# Patient Record
Sex: Female | Born: 1960 | Race: White | Hispanic: No | Smoking: Current every day smoker
Health system: Southern US, Community
[De-identification: ages and names within clinical notes are randomized; demographics above are authoritative.]

## PROBLEM LIST (undated history)

## (undated) DIAGNOSIS — J439 Emphysema, unspecified: Secondary | ICD-10-CM

## (undated) DIAGNOSIS — R51 Headache: Secondary | ICD-10-CM

## (undated) DIAGNOSIS — G8929 Other chronic pain: Secondary | ICD-10-CM

## (undated) HISTORY — DX: Headache: R51

## (undated) HISTORY — DX: Emphysema, unspecified: J43.9

## (undated) HISTORY — DX: Other chronic pain: G89.29

---

## 1982-10-12 HISTORY — PX: APPENDECTOMY: SHX54

## 1986-10-12 HISTORY — PX: GALLBLADDER SURGERY: SHX652

## 1986-10-12 HISTORY — PX: TUBAL LIGATION: SHX77

## 1990-10-12 HISTORY — PX: VAGINAL HYSTERECTOMY: SUR661

## 2014-02-27 ENCOUNTER — Ambulatory Visit (INDEPENDENT_AMBULATORY_CARE_PROVIDER_SITE_OTHER): Payer: Self-pay | Admitting: Critical Care Medicine

## 2014-02-27 ENCOUNTER — Encounter: Payer: Self-pay | Admitting: Critical Care Medicine

## 2014-02-27 VITALS — BP 136/84 | HR 76 | Ht 64.0 in | Wt 184.0 lb

## 2014-02-27 DIAGNOSIS — J441 Chronic obstructive pulmonary disease with (acute) exacerbation: Secondary | ICD-10-CM

## 2014-02-27 DIAGNOSIS — F17200 Nicotine dependence, unspecified, uncomplicated: Secondary | ICD-10-CM | POA: Insufficient documentation

## 2014-02-27 DIAGNOSIS — G8929 Other chronic pain: Secondary | ICD-10-CM

## 2014-02-27 DIAGNOSIS — J439 Emphysema, unspecified: Secondary | ICD-10-CM

## 2014-02-27 DIAGNOSIS — R51 Headache: Secondary | ICD-10-CM

## 2014-02-27 MED ORDER — PREDNISONE 10 MG PO TABS: ORAL_TABLET | ORAL | Status: DC

## 2014-02-27 MED ORDER — OMEPRAZOLE 20 MG PO CPDR: 20.0000 mg | DELAYED_RELEASE_CAPSULE | Freq: Two times a day (BID) | ORAL | Status: AC

## 2014-02-27 MED ORDER — AZITHROMYCIN 250 MG PO TABS: 250.0000 mg | ORAL_TABLET | Freq: Every day | ORAL | Status: DC

## 2014-02-27 NOTE — Assessment & Plan Note (Signed)
Asthmatic bronchitis with flare and ongoing tobacco abuse  plan Prednisone 10mg  Take 4 tablets daily for 5 days then stop Azithromycin 250mg  Take two once then one daily until gone Stay on symbicort two puff twice daily Increase omeprazole to 20mg  twice daily before meals Stop smoking, use nicorette minis Pulmonary functions will be obtained at [facility] Return 6 weeks

## 2014-02-27 NOTE — Progress Notes (Signed)
Subjective:    Patient ID: Debra Peterson, female    DOB: 06/19/1961, 53 y.o.   MRN: [id number]  HPI Comments: Pt just got medicaid. Dx Copd 7024yrs ago.  Pt smokes 5cig /day.  Was 1.5 ppd.  No oxygen.  No cpap at night  Shortness of Breath This is a chronic problem. The current episode started more than 1 year ago. Episode frequency: notes DOE, walking bedroom to kitchen, is out of breath. heat and humidity a problem. The problem has been gradually worsening. Associated symptoms include chest pain, headaches, orthopnea, PND, a sore throat, sputum production and wheezing. Pertinent negatives include no abdominal pain, claudication, coryza, ear pain, fever, hemoptysis, leg pain, leg swelling, neck pain, rash, rhinorrhea or vomiting. The symptoms are aggravated by weather changes, URIs, fumes, odors, lying flat, eating, any activity and exercise. Associated symptoms comments: Pt has daily GERD Chronic cough, min productive, mucus will look clear. Risk factors include smoking.   Never had any PFTs.    Past Medical History  Diagnosis Date  . Emphysema lung   . Chronic headaches      Family History  Problem Relation Age of Onset  . Emphysema Paternal Grandfather   . Emphysema Mother   . Asthma Brother     2 brothers  . Cancer Mother     lung  . Cancer Father     lung  . Cancer Paternal Grandmother     breast  . Cancer Paternal Aunt     breast  . Cancer Cousin     breast  . Heart disease Sister      History   Social History  . Marital Status: N/A    Spouse Name: N/A    Number of Children: N/A  . Years of Education: N/A   Occupational History  . Not on file.   Social History Main Topics  . Smoking status: Current Every Day Smoker -- 1.50 packs/day for 38 years    Types: Cigarettes  . Smokeless tobacco: Never Used     Comment: down to .25 pack/day X2 weeks.   . Alcohol Use: No  . Drug Use: No  . Sexual Activity: Not on file   Other Topics Concern  . Not on file    Social History Narrative  . No narrative on file     Allergies  Allergen Reactions  . Ivp Dye [Iodinated Diagnostic Agents]     hives     No outpatient prescriptions prior to visit.   No facility-administered medications prior to visit.      Review of Systems  Constitutional: Positive for appetite change. Negative for fever and unexpected weight change.  HENT: Positive for congestion and sore throat. Negative for dental problem, ear pain, nosebleeds, postnasal drip, rhinorrhea, sinus pressure, sneezing and trouble swallowing.   Eyes: Negative for redness and itching.  Respiratory: Positive for cough, sputum production, shortness of breath and wheezing. Negative for hemoptysis and chest tightness.   Cardiovascular: Positive for chest pain, orthopnea and PND. Negative for palpitations, claudication and leg swelling.  Gastrointestinal: Negative for nausea, vomiting and abdominal pain.  Genitourinary: Negative for dysuria.  Musculoskeletal: Negative for joint swelling and neck pain.  Skin: Negative for rash.  Neurological: Positive for headaches.  Hematological: Does not bruise/bleed easily.  Psychiatric/Behavioral: Positive for dysphoric mood. The patient is nervous/anxious.        Objective:   Physical Exam Filed Vitals:   02/27/14 1549  BP: 136/84  Pulse: 76  Height: 5'  4" (1.626 m)  Weight: 184 lb (83.462 kg)  SpO2: 95%    Gen: Pleasant, well-nourished, in no distress,  normal affect  ENT: No lesions,  mouth clear,  oropharynx clear, no postnasal drip  Neck: No JVD, no TMG, no carotid bruits  Lungs: No use of accessory muscles, no dullness to percussion, exp wheezes  Cardiovascular: RRR, heart sounds normal, no murmur or gallops, no peripheral edema  Abdomen: soft and NT, no HSM,  BS normal  Musculoskeletal: No deformities, no cyanosis or clubbing  Neuro: alert, non focal  Skin: Warm, no lesions or rashes  No results found.  CXR chronic  changes       Assessment & Plan:   Obstructive chronic bronchitis with exacerbation Asthmatic bronchitis with flare and ongoing tobacco abuse  plan Prednisone 10mg  Take 4 tablets daily for 5 days then stop Azithromycin 250mg  Take two once then one daily until gone Stay on symbicort two puff twice daily Increase omeprazole to 20mg  twice daily before meals Stop smoking, use nicorette minis Pulmonary functions will be obtained at [facility] Return 6 weeks    Updated Medication List Outpatient Encounter Prescriptions as of 02/27/2014  Medication Sig  . albuterol (PROVENTIL HFA;VENTOLIN HFA) 108 (90 BASE) MCG/ACT inhaler Inhale 2 puffs into the lungs every 6 (six) hours as needed for wheezing or shortness of breath.  Marland Kitchen. albuterol (PROVENTIL) (2.5 MG/3ML) 0.083% nebulizer solution Take 2.5 mg by nebulization every 6 (six) hours as needed for wheezing or shortness of breath.  . budesonide-formoterol (SYMBICORT) 160-4.5 MCG/ACT inhaler Inhale 2 puffs into the lungs 2 (two) times daily.  Marland Kitchen. buPROPion (WELLBUTRIN SR) 150 MG 12 hr tablet Take 150 mg by mouth daily.  Marland Kitchen. dicyclomine (BENTYL) 10 MG capsule Take 10 mg by mouth 2 (two) times daily.  Marland Kitchen. omeprazole (PRILOSEC) 20 MG capsule Take 1 capsule (20 mg total) by mouth 2 (two) times daily before a meal.  . sertraline (ZOLOFT) 100 MG tablet Take 100 mg by mouth daily.  . [DISCONTINUED] omeprazole (PRILOSEC) 20 MG capsule Take 20 mg by mouth daily.  Marland Kitchen. azithromycin (ZITHROMAX) 250 MG tablet Take 1 tablet (250 mg total) by mouth daily. Take two once then one daily until gone  . predniSONE (DELTASONE) 10 MG tablet Take 4 tablets daily for 5 days then stop

## 2014-02-27 NOTE — Patient Instructions (Signed)
Prednisone 10mg  Take 4 tablets daily for 5 days then stop Azithromycin 250mg  Take two once then one daily until gone Stay on symbicort two puff twice daily Increase omeprazole to 20mg  twice daily before meals Stop smoking, use nicorette minis Pul[facility] functions will be obtained at [facility] Return 6 weeks

## 2014-02-28 DIAGNOSIS — M5412 Radiculopathy, cervical region: Secondary | ICD-10-CM | POA: Insufficient documentation

## 2014-02-28 DIAGNOSIS — G43909 Migraine, unspecified, not intractable, without status migrainosus: Secondary | ICD-10-CM | POA: Insufficient documentation

## 2014-03-16 ENCOUNTER — Telehealth: Payer: Self-pay | Admitting: Critical Care Medicine

## 2014-03-16 DIAGNOSIS — J441 Chronic obstructive pulmonary disease with (acute) exacerbation: Secondary | ICD-10-CM

## 2014-03-16 NOTE — Telephone Encounter (Signed)
Call pt and tell her pfts show mild obstruction and reversable Stay on symbicort for now.

## 2014-03-19 NOTE — Telephone Encounter (Signed)
Called, spoke with pt.  Informed her of PFT results and recs per PW.  She verbalized understanding and voiced no further questions or concerns at this time.

## 2014-04-10 ENCOUNTER — Encounter: Payer: Self-pay | Admitting: Critical Care Medicine

## 2014-04-10 ENCOUNTER — Telehealth: Payer: Self-pay | Admitting: Critical Care Medicine

## 2014-04-10 ENCOUNTER — Ambulatory Visit (INDEPENDENT_AMBULATORY_CARE_PROVIDER_SITE_OTHER): Payer: Self-pay | Admitting: Critical Care Medicine

## 2014-04-10 VITALS — BP 118/82 | HR 83 | Temp 98.2°F | Ht 64.0 in | Wt 183.8 lb

## 2014-04-10 DIAGNOSIS — J441 Chronic obstructive pulmonary disease with (acute) exacerbation: Secondary | ICD-10-CM

## 2014-04-10 DIAGNOSIS — Z23 Encounter for immunization: Secondary | ICD-10-CM

## 2014-04-10 MED ORDER — METHYLPREDNISOLONE ACETATE 80 MG/ML IJ SUSP
120.0000 mg | Freq: Once | INTRAMUSCULAR | Status: AC
  Administered 2014-04-10: 120 mg via INTRAMUSCULAR

## 2014-04-10 NOTE — Assessment & Plan Note (Signed)
A depo medrol 120mg injection was given Focus on smoking cessation Stay on symbicort and use nebulizer as needed Pneumovax was given Return 2 months 

## 2014-04-10 NOTE — Progress Notes (Signed)
Subjective:    Patient ID: Debra Peterson, female    DOB: 06-Dec-1960, 53 y.o.   MRN: [id number]  HPI Comments: Pt just got medicaid. Dx Copd 1935yrs ago.  Pt smokes 5cig /day.  Was 1.5 ppd.  No oxygen.  No cpap at night  Never had any PFTs.    04/10/2014 Chief Complaint  Patient presents with  . Follow-up    cough same,sob same, humidity makes worse, staying inside,left side back soreness,wheezing, no fcs, midchest tightness, needs new neb. machine uses with oxygen mask  Pt still smoking. Difficult time with the heat. Pt notespred helped .  The pt is still on symbicort daily.  Pt notes some cough and wheezing.   Pt denies any significant sore throat, nasal congestion or excess secretions, fever, chills, sweats, unintended weight loss, pleurtic or exertional chest pain, orthopnea PND, or leg swelling Pt denies any increase in rescue therapy over baseline, denies waking up needing it or having any early am or nocturnal exacerbations of coughing/wheezing/or dyspnea.  Heat is an issue     Review of Systems  Constitutional: Positive for appetite change. Negative for unexpected weight change.  HENT: Positive for congestion. Negative for dental problem, nosebleeds, postnasal drip, sinus pressure, sneezing and trouble swallowing.   Eyes: Negative for redness and itching.  Respiratory: Positive for cough. Negative for chest tightness.   Cardiovascular: Negative for palpitations.  Gastrointestinal: Negative for nausea.  Genitourinary: Negative for dysuria.  Musculoskeletal: Negative for joint swelling.  Hematological: Does not bruise/bleed easily.  Psychiatric/Behavioral: Positive for dysphoric mood. The patient is nervous/anxious.        Objective:   Physical Exam  Filed Vitals:   04/10/14 1051  BP: 118/82  Pulse: 83  Temp: 98.2 F (36.8 C)  TempSrc: Oral  Height: 5\' 4"  (1.626 m)  Weight: 183 lb 12.8 oz (83.371 kg)  SpO2: 99%    Gen: Pleasant, well-nourished, in no distress,   normal affect  ENT: No lesions,  mouth clear,  oropharynx clear, no postnasal drip  Neck: No JVD, no TMG, no carotid bruits  Lungs: No use of accessory muscles, no dullness to percussion, exp wheezes  Cardiovascular: RRR, heart sounds normal, no murmur or gallops, no peripheral edema  Abdomen: soft and NT, no HSM,  BS normal  Musculoskeletal: No deformities, no cyanosis or clubbing  Neuro: alert, non focal  Skin: Warm, no lesions or rashes  No results found.  Cleda DaubSpiro: mild obstruction      Assessment & Plan:   Obstructive chronic bronchitis with exacerbation A depo medrol 120mg  injection was given Focus on smoking cessation Stay on symbicort and use nebulizer as needed Pneumovax was given Return 2 months     Updated Medication List Outpatient Encounter Prescriptions as of 04/10/2014  Medication Sig  . albuterol (PROVENTIL HFA;VENTOLIN HFA) 108 (90 BASE) MCG/ACT inhaler Inhale 2 puffs into the lungs every 6 (six) hours as needed for wheezing or shortness of breath.  Marland Kitchen. albuterol (PROVENTIL) (2.5 MG/3ML) 0.083% nebulizer solution Take 2.5 mg by nebulization every 6 (six) hours as needed for wheezing or shortness of breath.  . budesonide-formoterol (SYMBICORT) 160-4.5 MCG/ACT inhaler Inhale 2 puffs into the lungs 2 (two) times daily.  Marland Kitchen. buPROPion (WELLBUTRIN SR) 150 MG 12 hr tablet Take 150 mg by mouth daily.  Marland Kitchen. dicyclomine (BENTYL) 10 MG capsule Take 10 mg by mouth 2 (two) times daily.  Marland Kitchen. omeprazole (PRILOSEC) 20 MG capsule Take 1 capsule (20 mg total) by mouth 2 (two) times daily  before a meal.  . sertraline (ZOLOFT) 100 MG tablet Take 100 mg by mouth daily.  . [DISCONTINUED] azithromycin (ZITHROMAX) 250 MG tablet Take 1 tablet (250 mg total) by mouth daily. Take two once then one daily until gone  . [DISCONTINUED] predniSONE (DELTASONE) 10 MG tablet Take 4 tablets daily for 5 days then stop

## 2014-04-10 NOTE — Telephone Encounter (Signed)
Called home # provided. Was advised pt will not be back x 5 days as she went camping Called alternate # and went straight to VM-but this has not been set up yet wcb

## 2014-04-10 NOTE — Patient Instructions (Addendum)
A depo medrol 120mg  injection was given Focus on smoking cessation Stay on symbicort and use nebulizer as needed Pneumovax was given Return 2 months

## 2014-04-11 NOTE — Telephone Encounter (Signed)
Called spoke with patient @ [phone] Pt stated that [employer] has sent our office a records release form and has yet to hear anything.  Pt sees PW in the [place] office but provided the [place] office address.  Advised pt will try medical records to see if they received the request, and if not [facility] @ [phone].  Called [facility] and spoke with Marcelino DusterMichelle who did receive the request on 6.16.15, turn around time is 10-14 business days.  ATC pt to inform her of the above - line went straight to VM but unable to LM.  WCB.

## 2014-04-12 NOTE — Telephone Encounter (Signed)
ATC pt - line rang several times with NA and no option to leave msg.  WCB 

## 2014-04-16 NOTE — Telephone Encounter (Signed)
Called, spoke with pt.  Informed her of below - [employer] did receive the request on June 16 and turn around time is 10-14 business days.  She verbalized understanding and voiced no further questions or concerns at this time.

## 2014-05-15 ENCOUNTER — Ambulatory Visit (INDEPENDENT_AMBULATORY_CARE_PROVIDER_SITE_OTHER): Payer: Self-pay | Admitting: Critical Care Medicine

## 2014-05-15 ENCOUNTER — Encounter: Payer: Self-pay | Admitting: Critical Care Medicine

## 2014-05-15 VITALS — BP 122/82 | HR 79 | Temp 97.5°F | Ht 64.0 in | Wt 181.6 lb

## 2014-05-15 DIAGNOSIS — F17200 Nicotine dependence, unspecified, uncomplicated: Secondary | ICD-10-CM

## 2014-05-15 DIAGNOSIS — J441 Chronic obstructive pulmonary disease with (acute) exacerbation: Secondary | ICD-10-CM

## 2014-05-15 MED ORDER — PREDNISONE 10 MG PO TABS: ORAL_TABLET | ORAL | Status: DC

## 2014-05-15 MED ORDER — ALBUTEROL SULFATE (2.5 MG/3ML) 0.083% IN NEBU: 2.5000 mg | INHALATION_SOLUTION | Freq: Four times a day (QID) | RESPIRATORY_TRACT | Status: AC | PRN

## 2014-05-15 NOTE — Patient Instructions (Signed)
Prednisone 10mg  Take 4 for two days three for two days two for two days one for two days Albuterol refill sent to pharmacy An overnight sleep oximetry study was ordered per [facility] on room air Stay on symbicort Continue to focus on quitting smoking Staff will guide you on disability paperwork issue Return 2 months

## 2014-05-15 NOTE — Assessment & Plan Note (Signed)
Chronic obstructive airways disease with mild reversible airway obstruction and ongoing use Need to evaluate for nocturnal oxygen desaturation Plan Prednisone 10mg  Take 4 for two days three for two days two for two days one for two days Albuterol refill sent to pharmacy An overnight sleep oximetry study was ordered per [facility] on room air Stay on symbicort Continue to focus on quitting smoking Return 2 months

## 2014-05-15 NOTE — Progress Notes (Signed)
Subjective:    Patient ID: Debra Peterson, female    DOB: Oct 20, 1960, 53 y.o.   MRN: [id number]  HPI 05/15/2014 Chief Complaint  Patient presents with  . Follow-up    has [place] trip planned for next wk.,staying inside,sob-same,cough-dry, wheezing,midchest tightness, feels tight in left side of back,no fcs,sleeps a lot, no energy,out of albuterol neb. med,?'s about disablity papers   Pt with cough and dyspnea.  Pt remains fatigued  Pt does snore.  Never had sleep study Not much wheeze.  Left side hurts. Still smokes Pt denies any significant sore throat, nasal congestion or excess secretions, fever, chills, sweats, unintended weight loss, pleurtic or exertional chest pain, orthopnea PND, or leg swelling Pt denies any increase in rescue therapy over baseline, denies waking up needing it or having any early am or nocturnal exacerbations of coughing/wheezing/or dyspnea. Pt also denies any obvious fluctuation in symptoms with  weather or environmental change or other alleviating or aggravating factors     Review of Systems Constitutional:   No  weight loss, night sweats,  Fevers, chills, fatigue, lassitude. HEENT:   No headaches,  Difficulty swallowing,  Tooth/dental problems,  Sore throat,                No sneezing, itching, ear ache, nasal congestion, post nasal drip,   CV:  No chest pain,  Orthopnea, PND, swelling in lower extremities, anasarca, dizziness, palpitations  GI  No heartburn, indigestion, abdominal pain, nausea, vomiting, diarrhea, change in bowel habits, loss of appetite  Resp: Notes  shortness of breath with exertion not  at rest.  No excess mucus, no productive cough,  Notes  non-productive cough,  No coughing up of blood.  No change in color of mucus.  No wheezing.  No chest wall deformity  Skin: no rash or lesions.  GU: no dysuria, change in color of urine, no urgency or frequency.  No flank pain.  MS:  No joint pain or swelling.  No decreased range of motion.  No  back pain.  Psych:  No change in mood or affect. No depression or anxiety.  No memory loss.     Objective:   Physical Exam Filed Vitals:   05/15/14 1124  BP: 122/82  Pulse: 79  Temp: 97.5 F (36.4 C)  TempSrc: Oral  Height: 5\' 4"  (1.626 m)  Weight: 181 lb 9.6 oz (82.373 kg)  SpO2: 98%    Gen: Pleasant, well-nourished, in no distress,  normal affect  ENT: No lesions,  mouth clear,  oropharynx clear, no postnasal drip  Neck: No JVD, no TMG, no carotid bruits  Lungs: No use of accessory muscles, no dullness to percussion, distant breath sounds  Cardiovascular: RRR, heart sounds normal, no murmur or gallops, no peripheral edema  Abdomen: soft and NT, no HSM,  BS normal  Musculoskeletal: No deformities, no cyanosis or clubbing  Neuro: alert, non focal  Skin: Warm, no lesions or rashes  No results found.        Assessment & Plan:   Obstructive chronic bronchitis with exacerbation Chronic obstructive airways disease with mild reversible airway obstruction and ongoing use Need to evaluate for nocturnal oxygen desaturation Plan Prednisone 10mg  Take 4 for two days three for two days two for two days one for two days Albuterol refill sent to pharmacy An overnight sleep oximetry study was ordered per [facility] on room air Stay on symbicort Continue to focus on quitting smoking Return 2 months    Updated Medication  List Outpatient Encounter Prescriptions as of 05/15/2014  Medication Sig  . albuterol (PROVENTIL HFA;VENTOLIN HFA) 108 (90 BASE) MCG/ACT inhaler Inhale 2 puffs into the lungs every 6 (six) hours as needed for wheezing or shortness of breath.  Marland Kitchen albuterol (PROVENTIL) (2.5 MG/3ML) 0.083% nebulizer solution Take 3 mLs (2.5 mg total) by nebulization every 6 (six) hours as needed for wheezing or shortness of breath.  . budesonide-formoterol (SYMBICORT) 160-4.5 MCG/ACT inhaler Inhale 2 puffs into the lungs 2 (two) times daily.  Marland Kitchen buPROPion (WELLBUTRIN  SR) 150 MG 12 hr tablet Take 150 mg by mouth daily.  . diazepam (VALIUM) 5 MG tablet Take 5 mg by mouth.  . dicyclomine (BENTYL) 10 MG capsule Take 10 mg by mouth 2 (two) times daily.  . nabumetone (RELAFEN) 750 MG tablet Take 750 mg by mouth.  Marland Kitchen omeprazole (PRILOSEC) 20 MG capsule Take 1 capsule (20 mg total) by mouth 2 (two) times daily before a meal.  . ranitidine (ZANTAC) 150 MG tablet Once a day  . sertraline (ZOLOFT) 100 MG tablet Take 100 mg by mouth daily.  Marland Kitchen topiramate (TOPAMAX) 100 MG tablet Take 100 mg by mouth. Take 1 tablet by mouth at bedtime  . [DISCONTINUED] albuterol (PROVENTIL) (2.5 MG/3ML) 0.083% nebulizer solution Take 2.5 mg by nebulization every 6 (six) hours as needed for wheezing or shortness of breath.  Marland Kitchen oxycodone (OXY-IR) 5 MG capsule Take 5 mg by mouth.  . predniSONE (DELTASONE) 10 MG tablet Take 4 for two days three for two days two for two days one for two days  . [DISCONTINUED] topiramate (TOPAMAX) 25 MG tablet Take 50 mg by mouth.

## 2014-05-15 NOTE — Assessment & Plan Note (Signed)
The patient was issued additional smoking cessation counseling

## 2014-05-22 ENCOUNTER — Telehealth: Payer: Self-pay | Admitting: Critical Care Medicine

## 2014-05-22 DIAGNOSIS — J441 Chronic obstructive pulmonary disease with (acute) exacerbation: Secondary | ICD-10-CM

## 2014-05-22 NOTE — Telephone Encounter (Signed)
Tell pt her ONO was NORMAL, no oxygen needed

## 2014-05-22 NOTE — Telephone Encounter (Signed)
ATC pt, [phone], NA and no option to leave msg.  WCB ONO results in scan folder.

## 2014-05-25 NOTE — Telephone Encounter (Signed)
ATC PT NA WCB 

## 2014-05-28 NOTE — Telephone Encounter (Signed)
Called # listed as pt's home # - spoke with fried, Nellene.  Was advised pt can be reached at [phone]. Called # provided by friend - spoke with pt.  Informed her of ONO results and recs per Dr. Delford FieldWright.  She verbalized understanding and voiced no further questions or concerns at this time.

## 2014-09-04 ENCOUNTER — Telehealth: Payer: Self-pay | Admitting: Critical Care Medicine

## 2014-09-04 NOTE — Telephone Encounter (Signed)
Called and spoke to pt. Appt made with PW on 09/05/14 at 10:30am. Pt aware the appt is in the [place] office. Pt verbalized understanding and denied any further questions or concerns at this time.

## 2014-09-05 ENCOUNTER — Ambulatory Visit (INDEPENDENT_AMBULATORY_CARE_PROVIDER_SITE_OTHER): Payer: Medicaid Other | Admitting: Critical Care Medicine

## 2014-09-05 ENCOUNTER — Encounter (INDEPENDENT_AMBULATORY_CARE_PROVIDER_SITE_OTHER): Payer: Self-pay

## 2014-09-05 ENCOUNTER — Encounter: Payer: Self-pay | Admitting: Critical Care Medicine

## 2014-09-05 VITALS — BP 116/80 | HR 109 | Temp 98.7°F | Ht 64.0 in | Wt 186.8 lb

## 2014-09-05 DIAGNOSIS — J441 Chronic obstructive pulmonary disease with (acute) exacerbation: Secondary | ICD-10-CM

## 2014-09-05 MED ORDER — HYDROCODONE-HOMATROPINE 5-1.5 MG/5ML PO SYRP: 5.0000 mL | ORAL_SOLUTION | Freq: Four times a day (QID) | ORAL | Status: AC | PRN

## 2014-09-05 MED ORDER — METHYLPREDNISOLONE ACETATE 80 MG/ML IJ SUSP
120.0000 mg | Freq: Once | INTRAMUSCULAR | Status: AC
  Administered 2014-09-05: 120 mg via INTRAMUSCULAR

## 2014-09-05 MED ORDER — AZITHROMYCIN 250 MG PO TABS: 250.0000 mg | ORAL_TABLET | Freq: Every day | ORAL | Status: DC

## 2014-09-05 MED ORDER — PREDNISONE 10 MG PO TABS: ORAL_TABLET | ORAL | Status: AC

## 2014-09-05 MED ORDER — TIOTROPIUM BROMIDE MONOHYDRATE 2.5 MCG/ACT IN AERS: INHALATION_SPRAY | RESPIRATORY_TRACT | Status: DC

## 2014-09-05 NOTE — Assessment & Plan Note (Signed)
Chronic obstructive bronchitis with associated exacerbation Ongoing tobacco use Plan Prednisone 10mg  : Take 4 for four days 3 for four days 2 for four days 1 for four days  (more pred sent to pharmacy) Doreatha MartinFinish azithromycin A depomedrol 120mg  injection was given Add Spiriva to symbicort : use samples, refill sent Hycodan cough syrup was given to use as needed Return [place] 2 weeks

## 2014-09-05 NOTE — Progress Notes (Signed)
Subjective:    Patient ID: Debra Peterson, female    DOB: 12/09/1960, 53 y.o.   MRN: [id number]  HPI 09/05/2014 Chief Complaint  Patient presents with  . Follow-up    SOB cough wheezing   Exposure to paint, over the past week. Never had fumes issues.  Severe cough , dry.  Notes more wheezing. And dyspnea at rest.  No fever.  Notes some pndrip   Review of Systems Constitutional:   No  weight loss, night sweats,  Fevers, chills, fatigue, lassitude. HEENT:   No headaches,  Difficulty swallowing,  Tooth/dental problems,  Sore throat,                No sneezing, itching, ear ache, nasal congestion, post nasal drip,   CV:  No chest pain,  Orthopnea, PND, swelling in lower extremities, anasarca, dizziness, palpitations  GI  No heartburn, indigestion, abdominal pain, nausea, vomiting, diarrhea, change in bowel habits, loss of appetite  Resp: Notes  shortness of breath with exertion not  at rest.  Notes excess mucus, notes productive cough,  Notes  non-productive cough,  No coughing up of blood.  No change in color of mucus.  No wheezing.  No chest wall deformity  Skin: no rash or lesions.  GU: no dysuria, change in color of urine, no urgency or frequency.  No flank pain.  MS:  No joint pain or swelling.  No decreased range of motion.  No back pain.  Psych:  No change in mood or affect. No depression or anxiety.  No memory loss.     Objective:   Physical Exam Filed Vitals:   09/05/14 1032  BP: 116/80  Pulse: 109  Temp: 98.7 F (37.1 C)  Height: 5\' 4"  (1.626 m)  Weight: 186 lb 12.8 oz (84.732 kg)  SpO2: 97%    Gen: Pleasant, well-nourished, in no distress,  normal affect  ENT: No lesions,  mouth clear,  oropharynx clear, no postnasal drip  Neck: No JVD, no TMG, no carotid bruits  Lungs: No use of accessory muscles, no dullness to percussion, distant breath sounds, inspiratory expiratory wheezes  Cardiovascular: RRR, heart sounds normal, no murmur or gallops, no  peripheral edema  Abdomen: soft and NT, no HSM,  BS normal  Musculoskeletal: No deformities, no cyanosis or clubbing  Neuro: alert, non focal  Skin: Warm, no lesions or rashes  No results found.  An albuterol nebulizer treatment was administered      Assessment & Plan:   Obstructive chronic bronchitis with exacerbation Chronic obstructive bronchitis with associated exacerbation Ongoing tobacco use Plan Prednisone 10mg  : Take 4 for four days 3 for four days 2 for four days 1 for four days  (more pred sent to pharmacy) Doreatha MartinFinish azithromycin A depomedrol 120mg  injection was given Add Spiriva to symbicort : use samples, refill sent Hycodan cough syrup was given to use as needed Return [place] 2 weeks     Updated Medication List Outpatient Encounter Prescriptions as of 09/05/2014  Medication Sig  . albuterol (PROVENTIL HFA;VENTOLIN HFA) 108 (90 BASE) MCG/ACT inhaler Inhale 2 puffs into the lungs every 6 (six) hours as needed for wheezing or shortness of breath.  Marland Kitchen. albuterol (PROVENTIL) (2.5 MG/3ML) 0.083% nebulizer solution Take 3 mLs (2.5 mg total) by nebulization every 6 (six) hours as needed for wheezing or shortness of breath.  . budesonide-formoterol (SYMBICORT) 160-4.5 MCG/ACT inhaler Inhale 2 puffs into the lungs 2 (two) times daily.  Marland Kitchen. buPROPion (WELLBUTRIN SR) 150 MG 12 hr  tablet Take 150 mg by mouth daily.  Marland Kitchen. omeprazole (PRILOSEC) 20 MG capsule Take 1 capsule (20 mg total) by mouth 2 (two) times daily before a meal.  . ranitidine (ZANTAC) 150 MG tablet Once a day  . sertraline (ZOLOFT) 100 MG tablet Take 100 mg by mouth daily.  Marland Kitchen. topiramate (TOPAMAX) 100 MG tablet Take 100 mg by mouth. Take 1 tablet by mouth at bedtime  . azithromycin (ZITHROMAX) 250 MG tablet Take 1 tablet (250 mg total) by mouth daily. Take two once then one daily until gone  . diazepam (VALIUM) 5 MG tablet Take 5 mg by mouth.  . dicyclomine (BENTYL) 10 MG capsule Take 10 mg by mouth 2 (two) times  daily.  Marland Kitchen. HYDROcodone-homatropine (HYCODAN) 5-1.5 MG/5ML syrup Take 5 mLs by mouth every 6 (six) hours as needed for cough.  . nabumetone (RELAFEN) 750 MG tablet Take 750 mg by mouth.  Marland Kitchen. oxycodone (OXY-IR) 5 MG capsule Take 5 mg by mouth.  . predniSONE (DELTASONE) 10 MG tablet Take 4 for four days 3 for four days 2 for four days 1 for four days  . Tiotropium Bromide Monohydrate (SPIRIVA RESPIMAT) 2.5 MCG/ACT AERS Two puff daily  . [DISCONTINUED] predniSONE (DELTASONE) 10 MG tablet Take 4 for two days three for two days two for two days one for two days (Patient not taking: Reported on 09/05/2014)  . [EXPIRED] methylPREDNISolone acetate (DEPO-MEDROL) injection 120 mg

## 2014-09-05 NOTE — Patient Instructions (Addendum)
Prednisone 10mg  : Take 4 for four days 3 for four days 2 for four days 1 for four days  (more pred sent to pharmacy) Doreatha MartinFinish azithromycin A depomedrol 120mg  injection was given Add Spiriva to symbicort : use samples, refill sent Hycodan cough syrup was given to use as needed Return [place] 2 weeks

## 2014-09-10 ENCOUNTER — Telehealth: Payer: Self-pay | Admitting: Critical Care Medicine

## 2014-09-10 MED ORDER — TIOTROPIUM BROMIDE MONOHYDRATE 2.5 MCG/ACT IN AERS: INHALATION_SPRAY | RESPIRATORY_TRACT | Status: DC

## 2014-09-10 NOTE — Telephone Encounter (Signed)
Called and spoke with the pt and she stated that the pharmacy told her to call and have us send the spiriva back to them.  This didn't go through with her medicaid.  She stated that she needs this sent in today.  This has been done and nothing further is needed.

## 2014-09-12 ENCOUNTER — Telehealth: Payer: Self-pay | Admitting: Critical Care Medicine

## 2014-09-12 MED ORDER — TIOTROPIUM BROMIDE MONOHYDRATE 18 MCG IN CAPS: 18.0000 ug | ORAL_CAPSULE | Freq: Every day | RESPIRATORY_TRACT | Status: AC

## 2014-09-12 NOTE — Telephone Encounter (Signed)
Spoke with pt's EC Debra Peterson, states that the spiriva respimat is not covered by [employer] insurance.  Was given samples of med last week at appt.    Dr. Delford FieldWright is there an alternative you'd like to use?  Thanks!

## 2014-09-12 NOTE — Telephone Encounter (Signed)
Called pt and is aware. She needs RX sent into [facility]. I have done so. Nothing further needed

## 2014-09-12 NOTE — Telephone Encounter (Signed)
File an appeal, give more samples in meantime

## 2014-09-12 NOTE — Telephone Encounter (Signed)
Ok to try spiriva handihaler one cap two puff daily

## 2014-09-12 NOTE — Telephone Encounter (Signed)
Spoke with pharmacist to initiate PA.  He states that the respimat is not covered by insurance but the handihaler might be.  He would need to try to run a prescription to see if it's covered.   Dr. Delford FieldWright are you ok with this?  Also, [place] and [place] an appt with [place] in [place] on 12/9.  She is aware that we can provide her with more samples at this visit as needed to help her with her med coverage until this is approved.

## 2014-09-19 ENCOUNTER — Ambulatory Visit (INDEPENDENT_AMBULATORY_CARE_PROVIDER_SITE_OTHER)
Admission: RE | Admit: 2014-09-19 | Discharge: 2014-09-19 | Disposition: A | Discharge status: Home / Self Care | Payer: Medicaid Other | Source: Ambulatory Visit | Attending: Adult Health | Admitting: Adult Health

## 2014-09-19 ENCOUNTER — Encounter: Payer: Self-pay | Admitting: Adult Health

## 2014-09-19 ENCOUNTER — Ambulatory Visit (INDEPENDENT_AMBULATORY_CARE_PROVIDER_SITE_OTHER): Payer: Medicaid Other | Admitting: Adult Health

## 2014-09-19 VITALS — BP 116/68 | HR 83 | Temp 99.0°F | Ht 64.0 in | Wt 186.8 lb

## 2014-09-19 DIAGNOSIS — J441 Chronic obstructive pulmonary disease with (acute) exacerbation: Secondary | ICD-10-CM

## 2014-09-19 MED ORDER — AMOXICILLIN-POT CLAVULANATE 875-125 MG PO TABS: 1.0000 | ORAL_TABLET | Freq: Two times a day (BID) | ORAL | Status: AC

## 2014-09-19 MED ORDER — PREDNISONE 10 MG PO TABS: ORAL_TABLET | ORAL | Status: AC

## 2014-09-19 MED ORDER — CLOTRIMAZOLE 10 MG MT TROC: 10.0000 mg | Freq: Every day | OROMUCOSAL | Status: AC

## 2014-09-19 NOTE — Progress Notes (Signed)
   Subjective:    Patient ID: Debra Peterson, female    DOB: 1961/08/18, 53 y.o.   MRN: [id number]  HPI 53 yo female with COPD active smoker   09/19/2014 Acute OV  Returns for  2 week follow up . Seen last week with bronchitic flare , tx with Zpack , Depo Medrol , and steroid taper, and Hycodan.  Feels breathing/cough are unchanged with dry cough, wheezing, dyspnea, tightness in chest.  Fatigue is worse since last ov. Cough is severe and hard to cough up mucus-it is so thick . Patient denies any hemoptysis, orthopnea, PND or leg swelling Continues to smoke,  Cessation discussed .  Review of Systems Constitutional:   No  weight loss, night sweats,  Fevers, chills, + fatigue, or  lassitude.  HEENT:   No headaches,  Difficulty swallowing,  Tooth/dental problems, or  Sore throat,                No sneezing, itching, ear ache, + nasal congestion, post nasal drip,   CV:  No chest pain,  Orthopnea, PND, swelling in lower extremities, anasarca, dizziness, palpitations, syncope.   GI  No heartburn, indigestion, abdominal pain, nausea, vomiting, diarrhea, change in bowel habits, loss of appetite, bloody stools.   Resp:    No chest wall deformity  Skin: no rash or lesions.  GU: no dysuria, change in color of urine, no urgency or frequency.  No flank pain, no hematuria   MS:  No joint pain or swelling.  No decreased range of motion.  No back pain.  Psych:  No change in mood or affect. No depression or anxiety.  No memory loss.         Objective:   Physical Exam GEN: A/Ox3; pleasant , NAD, obese , barking cough   HEENT:  [place]/AT,  EACs-clear, TMs-wnl, NOSE-clear drainage, nontender sinus  THROAT-clear, no lesions, no postnasal drip or exudate noted. , scattered white patches along post pharynx  NECK:  Supple w/ fair ROM; no JVD; normal carotid impulses w/o bruits; no thyromegaly or nodules palpated; no lymphadenopathy.  RESP  Coarse rhonchi , faint exp wheezing, no stridor , talking  in full sentences, no accessory muscle use, no dullness to percussion  CARD:  RRR, no m/r/g  , no peripheral edema, pulses intact, no cyanosis or clubbing.  GI:   Soft & nt; nml bowel sounds; no organomegaly or masses detected.  Musco: Warm bil, no deformities or joint swelling noted.   Neuro: alert, no focal deficits noted.    Skin: Warm, no lesions or rashes        Assessment & Plan:

## 2014-09-19 NOTE — Patient Instructions (Addendum)
Augmentin 875 mg twice daily for 7 days, take with food. Mucinex DM twice daily as needed for cough congested. Prednisone taper over the next week. Mycelex 5 times daily for 1 week Fluids and rest Chest x-ray today Continue to work on smoking cessation Follow with Dr. Delford FieldWright [place] Please contact office for sooner follow up if symptoms do not improve or worsen or seek emergency care

## 2014-09-19 NOTE — Addendum Note (Signed)
Addended by: JONES, JESSICA E on: 09/19/2014 12:08 PM   Modules accepted: Orders  

## 2014-09-19 NOTE — Assessment & Plan Note (Signed)
Slow to resolve flare w/ oral candidiasis  Check cxr today   Plan  Augmentin 875 mg twice daily for 7 days, take with food. Mucinex DM twice daily as needed for cough congested. Prednisone taper over the next week. Mycelex 5 times daily for 1 week Fluids and rest Chest x-ray today Continue to work on smoking cessation Follow with Dr. Delford FieldWright in 4-6 weeks [place] Please contact office for sooner follow up if symptoms do not improve or worsen or seek emergency care

## 2014-10-24 ENCOUNTER — Ambulatory Visit: Payer: Medicaid Other | Admitting: Critical Care Medicine

## 2015-10-07 IMAGING — CR DG CHEST 2V
2 series · 2 of 2 positions shown · non-contrast
Comparison: Chest x-rays dated 01/22/2014 and 10/02/2013

CLINICAL DATA: Cough and dyspnea on exertion. Chronic obstructive
bronchitis with exacerbation.

EXAM:
CHEST  2 VIEW

[view not recorded (1 of 2)]
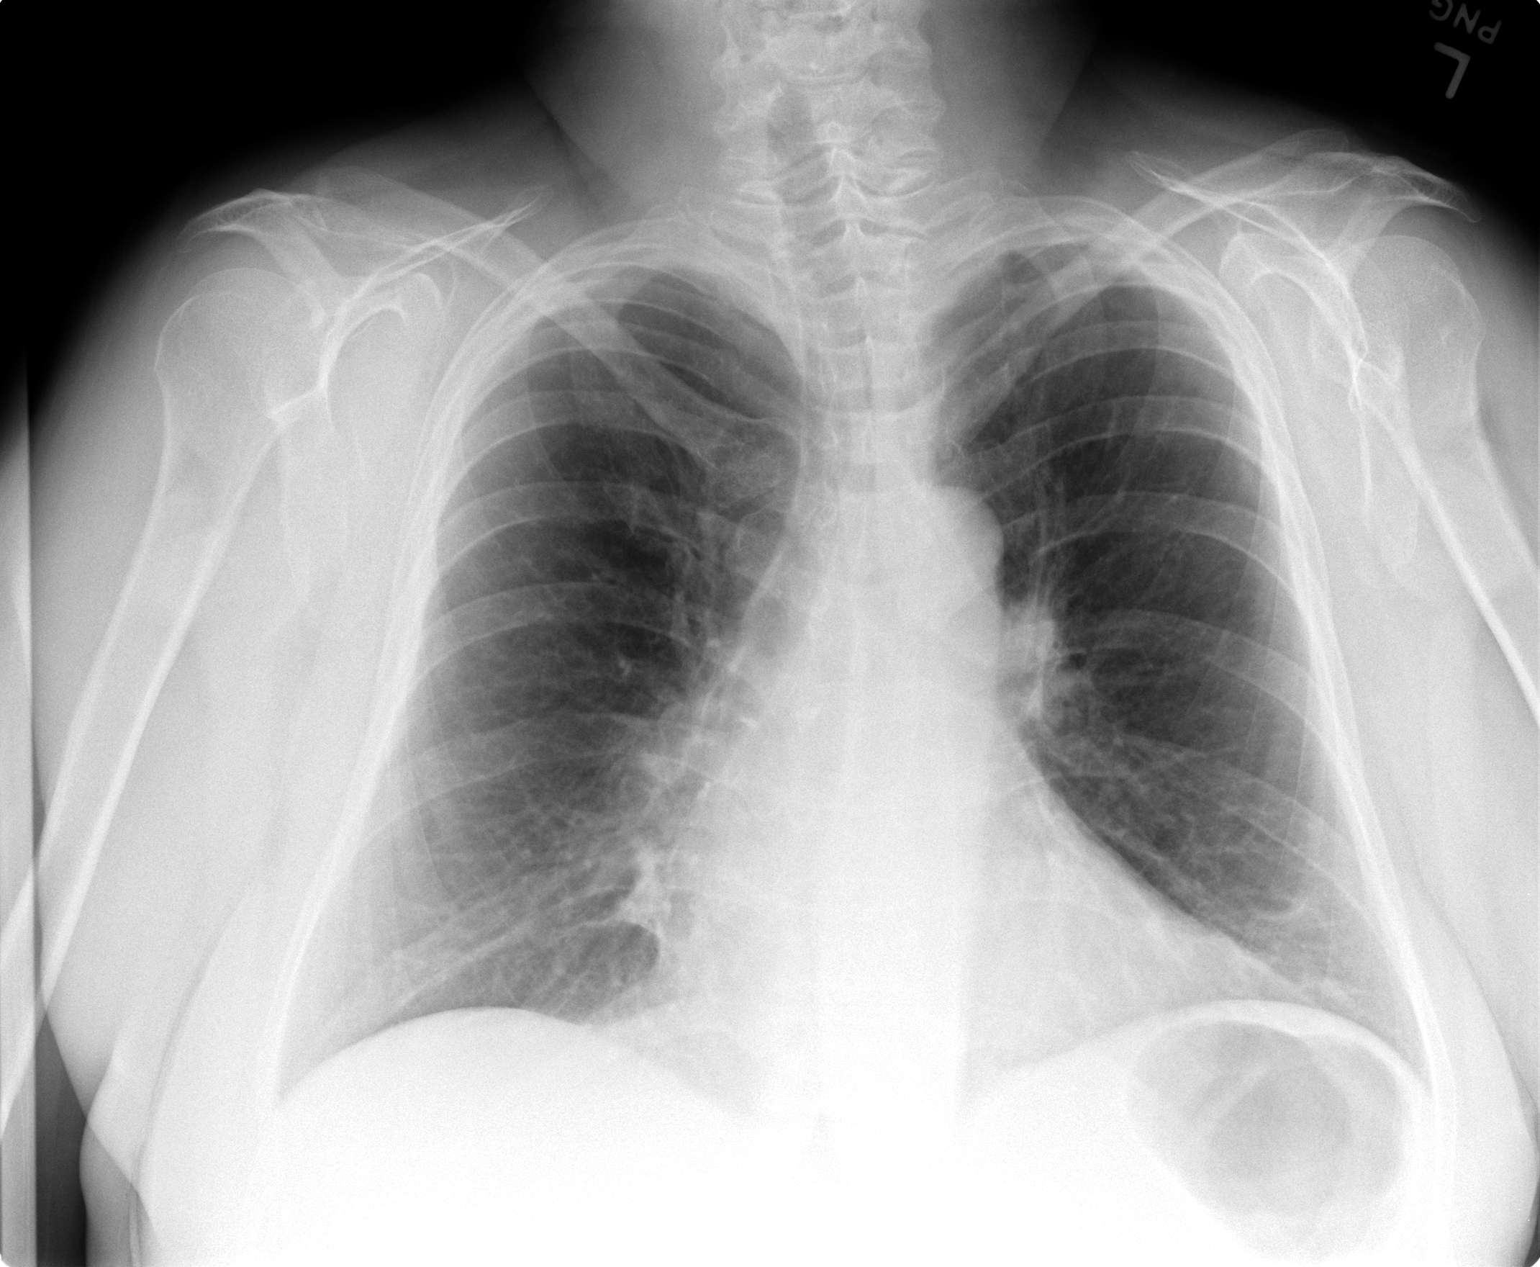

[view not recorded (2 of 2)]
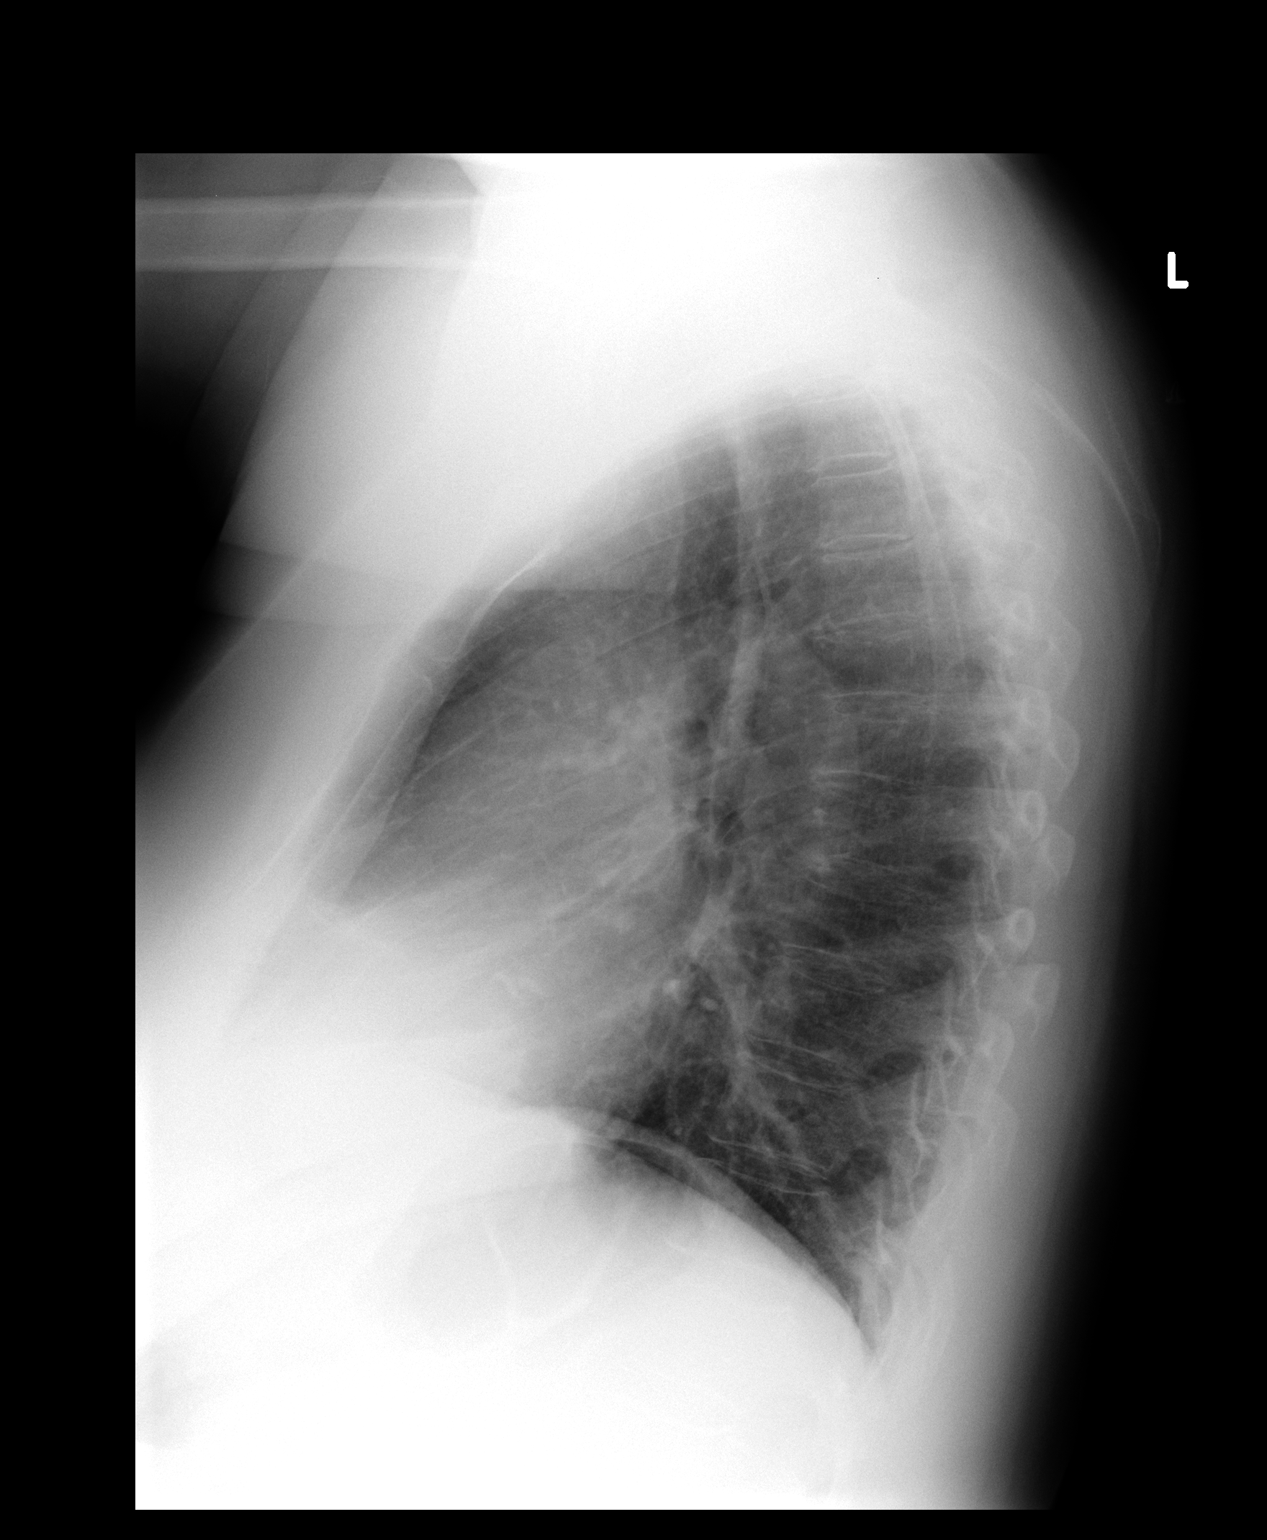

[2 of 2 positions shown; findings below may reference images not displayed]

FINDINGS: There is minimal atelectasis in the medial aspect of the right
middle lobe and at the left lung base laterally. Lungs are otherwise
clear. Heart size and vascularity are normal. No significant osseous
abnormality. No effusions.
IMPRESSION: Slight atelectasis in the right middle lobe and left lung base.
